# Patient Record
Sex: Female | Born: 1980 | Race: White | Hispanic: No | Marital: Married | Smoking: Never smoker
Health system: Southern US, Community
[De-identification: ages and names within clinical notes are randomized; demographics above are authoritative.]

---

## 2004-08-22 ENCOUNTER — Emergency Department (HOSPITAL_COMMUNITY): Admission: EM | Admit: 2004-08-22 | Discharge: 2004-08-22 | Payer: Self-pay | Admitting: Emergency Medicine

## 2005-01-02 ENCOUNTER — Inpatient Hospital Stay (HOSPITAL_COMMUNITY): Admission: AD | Admit: 2005-01-02 | Discharge: 2005-01-02 | Payer: Self-pay | Admitting: *Deleted

## 2005-01-15 ENCOUNTER — Ambulatory Visit: Payer: Self-pay | Admitting: *Deleted

## 2005-04-09 ENCOUNTER — Ambulatory Visit: Payer: Self-pay | Admitting: Unknown Physician Specialty

## 2005-04-30 ENCOUNTER — Ambulatory Visit: Payer: Self-pay | Admitting: Otolaryngology

## 2006-01-18 ENCOUNTER — Ambulatory Visit: Payer: Self-pay | Admitting: Otolaryngology

## 2006-04-06 IMAGING — US US PELVIS COMPLETE MODIFY
1 series · 14 of 25 positions shown · non-contrast
Comparison: None.

CLINICAL DATA: Irregular menstrual cycles, currently bleeding.  
 TRANSABDOMINAL AND TRANSVAGINAL PELVIC ULTRASOUND:
TECHNIQUE: Both transabdominal and transvaginal ultrasound examinations of the pelvis were performed including evaluation of the uterus, ovaries, adnexal regions, and pelvic cul-de-sac.

[Series 1: us pelvis complete modify · 0.39mm/px · 14 of 84 slices shown]
[im 1/84]
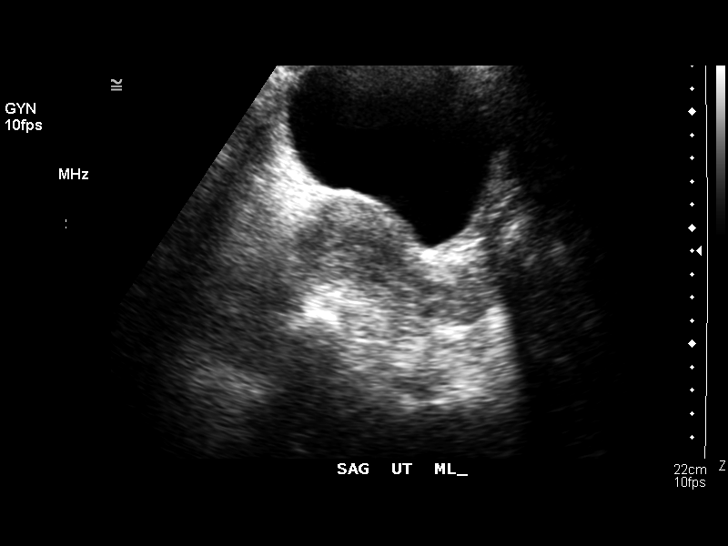
[im 7/84]
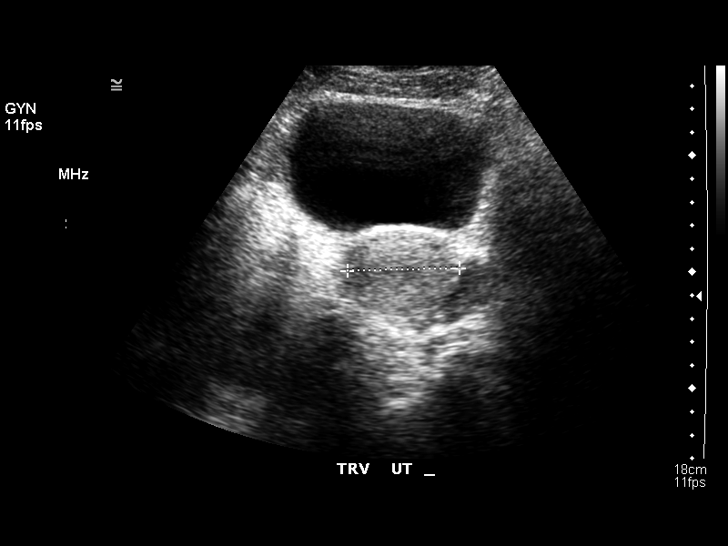
[im 14/84]
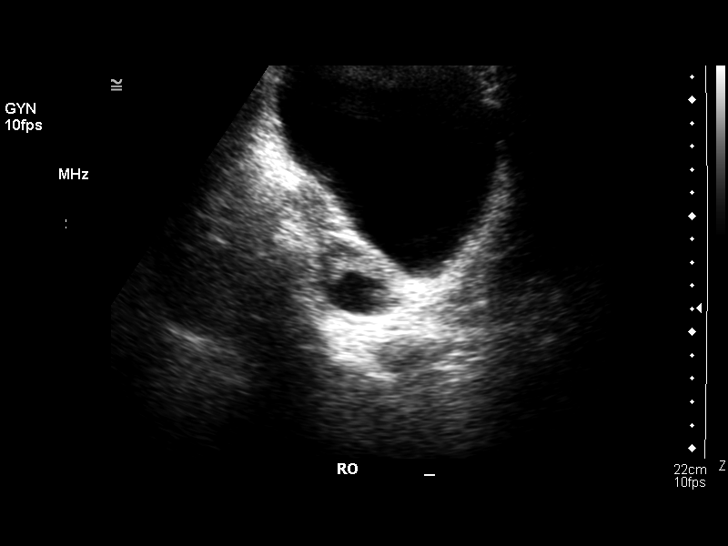
[im 21/84]
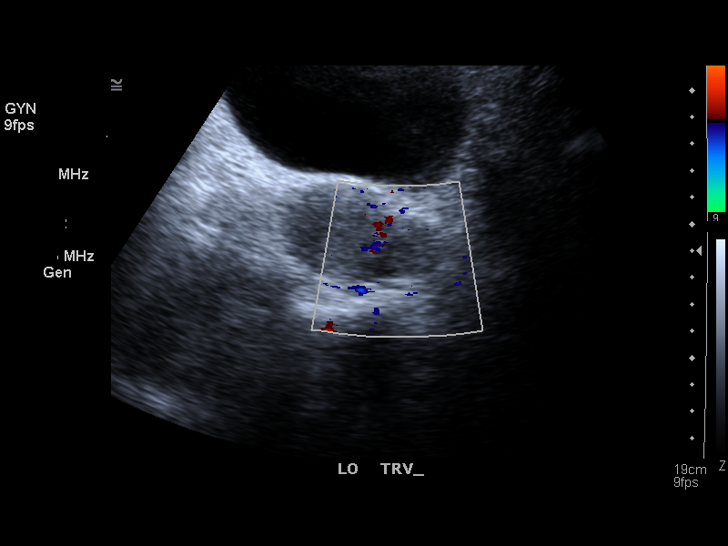
[im 28/84]
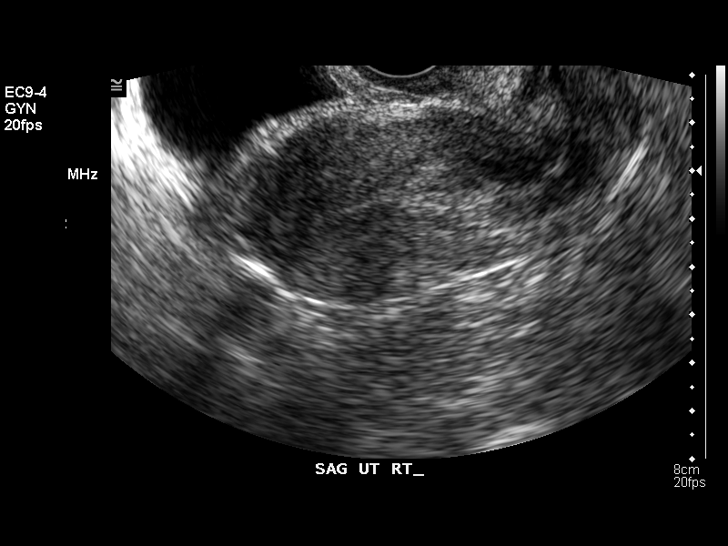
[im 32/84]
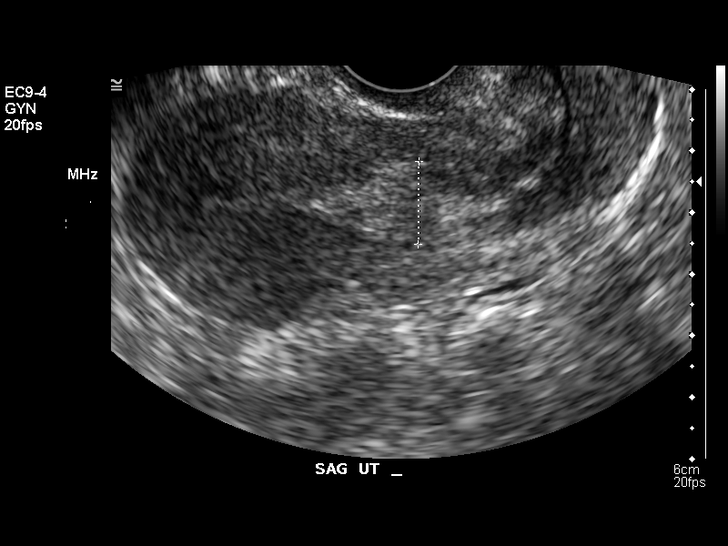
[im 39/84]
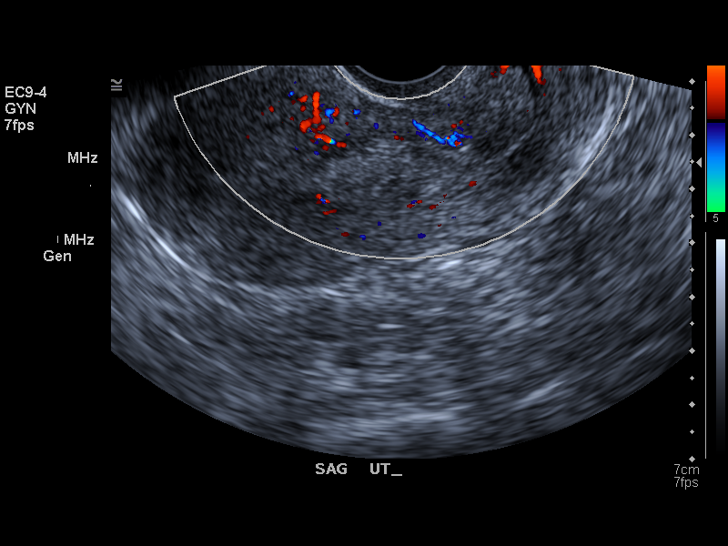
[im 45/84]
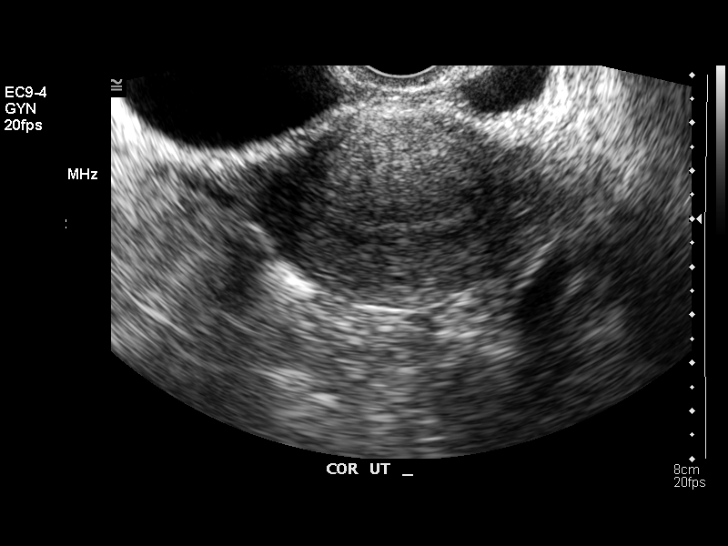
[im 52/84]
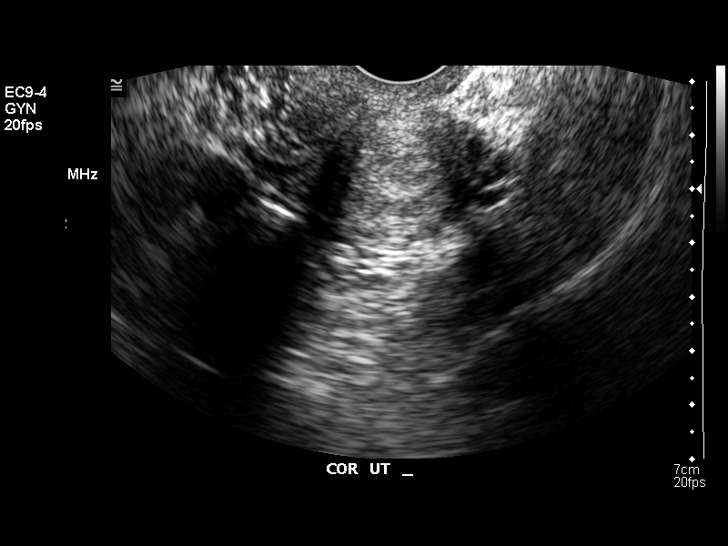
[im 56/84]
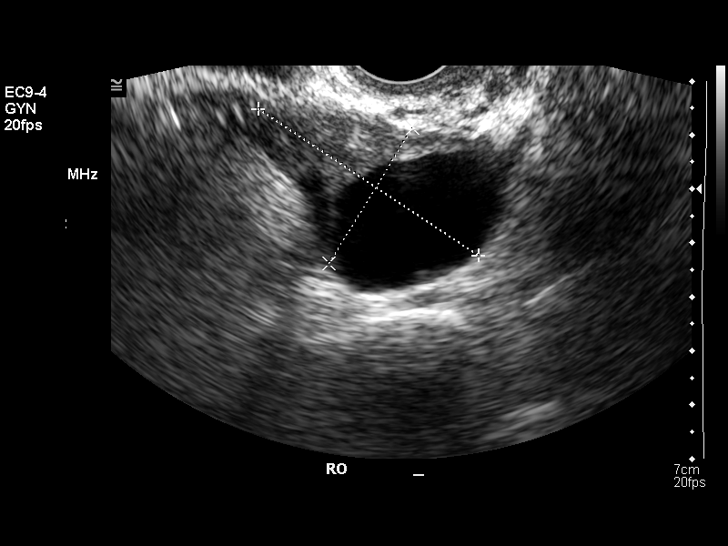
[im 63/84]
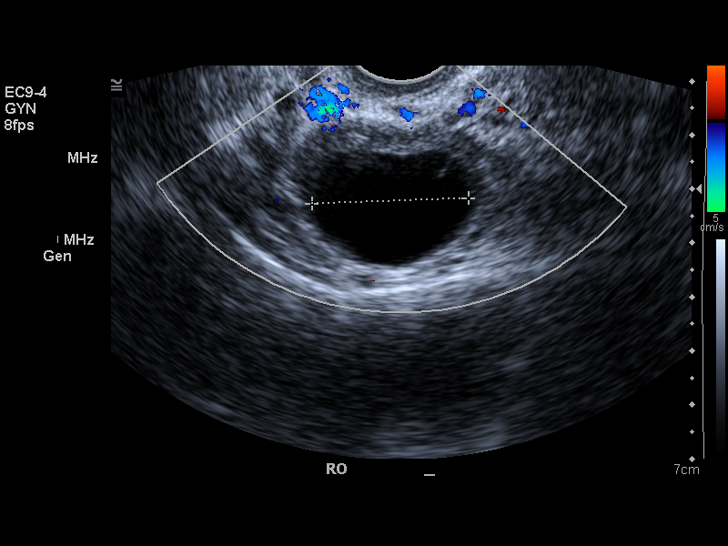
[im 70/84]
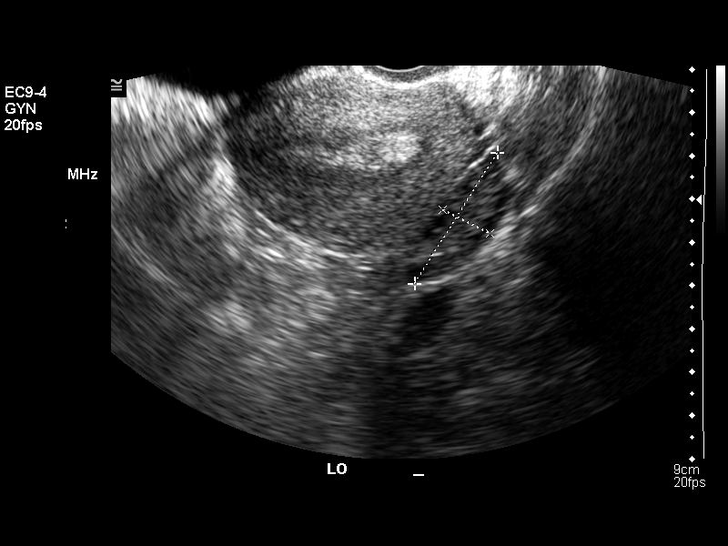
[im 77/84]
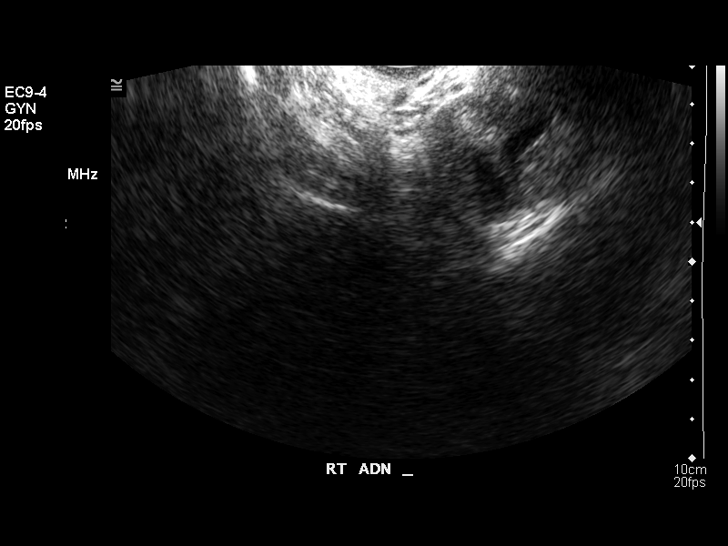
[im 84/84]
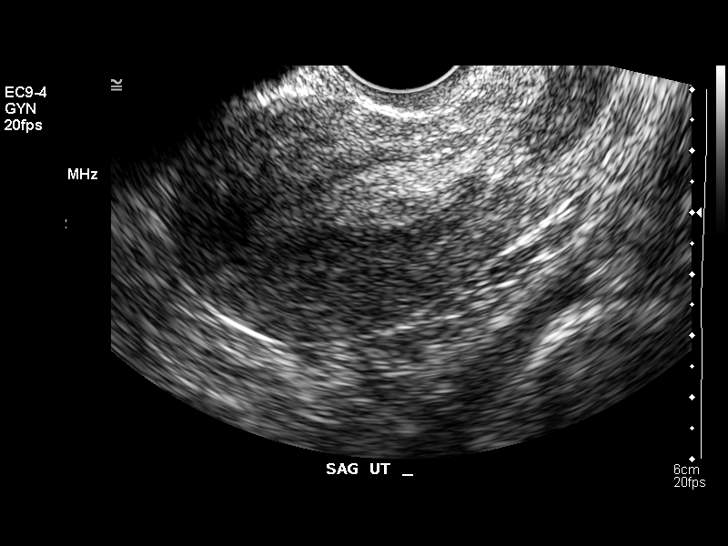

[14 of 25 positions shown; findings below may reference images not displayed]

FINDINGS: The uterus is unremarkable.  The endometrium is focally thickened at the mid-portion of the uterine body, with a maximal thickness measuring 1.9 cm.  There is a suggestion of an oval mass-like area of thickening, although no definitive color flow or stalk could be seen on color flow or Power Doppler imaging.  There is a 3.2 x 2.9 x 2.4 cm anechoic simple-appearing right ovarian cyst.  The left ovary is normal.  No free fluid is present.
IMPRESSION: 1.  Focal thickening of the mid-portion of the endometrium.  Appearance is most typical for a polyp, although endometrial hyperplasia and neoplasm could have a similar appearance.  This is a amenable to sonohysterogram performed after the patient is no longer actively bleeding.  
 2.  Simple appearing right ovarian cyst.

## 2008-02-01 ENCOUNTER — Ambulatory Visit: Payer: Self-pay | Admitting: Obstetrics & Gynecology

## 2009-08-22 ENCOUNTER — Inpatient Hospital Stay: Payer: Self-pay | Admitting: Obstetrics & Gynecology

## 2010-05-10 ENCOUNTER — Encounter: Payer: Self-pay | Admitting: *Deleted

## 2011-05-20 ENCOUNTER — Ambulatory Visit: Payer: Self-pay | Admitting: Unknown Physician Specialty

## 2015-12-12 ENCOUNTER — Other Ambulatory Visit: Payer: Self-pay | Admitting: Nurse Practitioner

## 2015-12-13 ENCOUNTER — Other Ambulatory Visit: Payer: Self-pay | Admitting: Nurse Practitioner

## 2015-12-13 DIAGNOSIS — N644 Mastodynia: Secondary | ICD-10-CM

## 2015-12-20 ENCOUNTER — Ambulatory Visit
Admission: RE | Admit: 2015-12-20 | Discharge: 2015-12-20 | Disposition: A | Discharge status: Home / Self Care | Payer: BC Managed Care – PPO | Source: Ambulatory Visit | Attending: Nurse Practitioner | Admitting: Nurse Practitioner

## 2015-12-20 ENCOUNTER — Other Ambulatory Visit: Payer: Self-pay | Admitting: Nurse Practitioner

## 2015-12-20 DIAGNOSIS — N644 Mastodynia: Secondary | ICD-10-CM

## 2015-12-20 DIAGNOSIS — R52 Pain, unspecified: Secondary | ICD-10-CM

## 2015-12-27 ENCOUNTER — Ambulatory Visit
Admission: RE | Admit: 2015-12-27 | Discharge: 2015-12-27 | Disposition: A | Discharge status: Home / Self Care | Payer: BC Managed Care – PPO | Source: Ambulatory Visit | Attending: Nurse Practitioner | Admitting: Nurse Practitioner

## 2015-12-27 DIAGNOSIS — R52 Pain, unspecified: Secondary | ICD-10-CM

## 2017-03-30 IMAGING — US US CHEST/MEDIASTINUM
1 series · 13 of 13 positions shown · non-contrast
Comparison: None.

CLINICAL DATA: Left anterior ventral chest wall pain just below the
left breast.

EXAM:
CHEST ULTRASOUND

[Series 1: us chest/mediastinum · 0.06mm/px · 13 acquisitions, 13 frames shown]
[im 1/13]
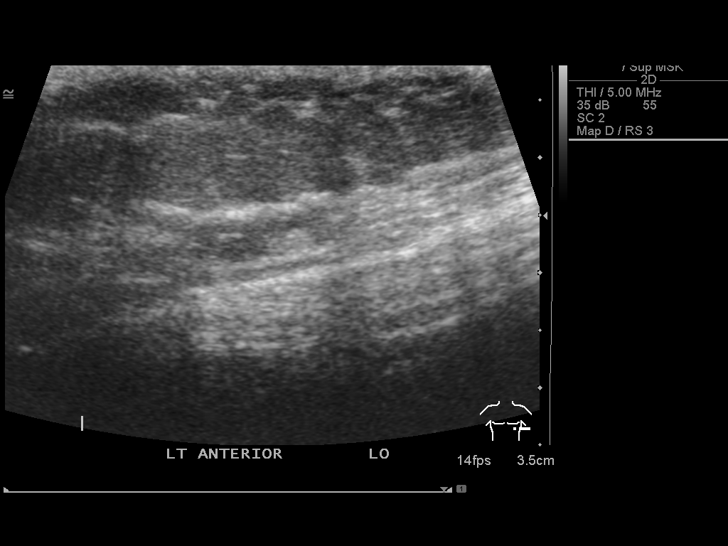
[im 2/13]
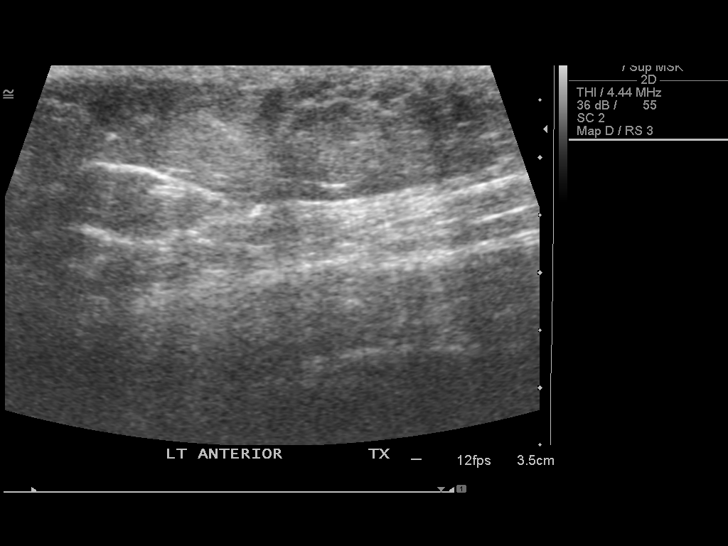
[im 3/13]
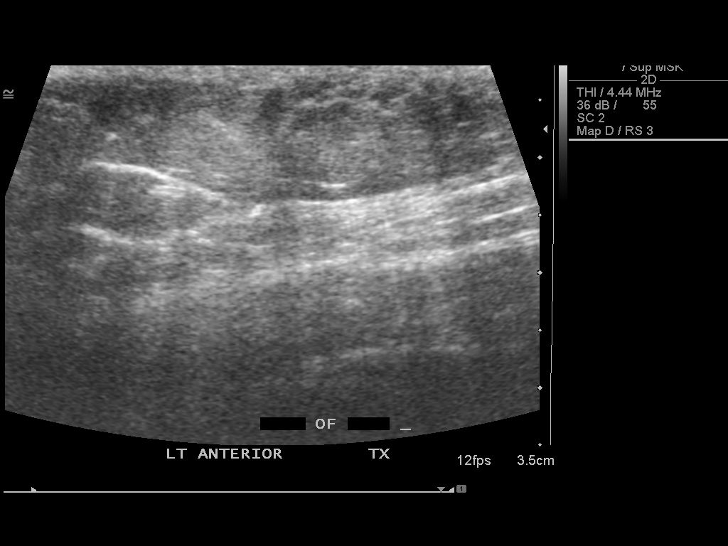
[im 4/13]
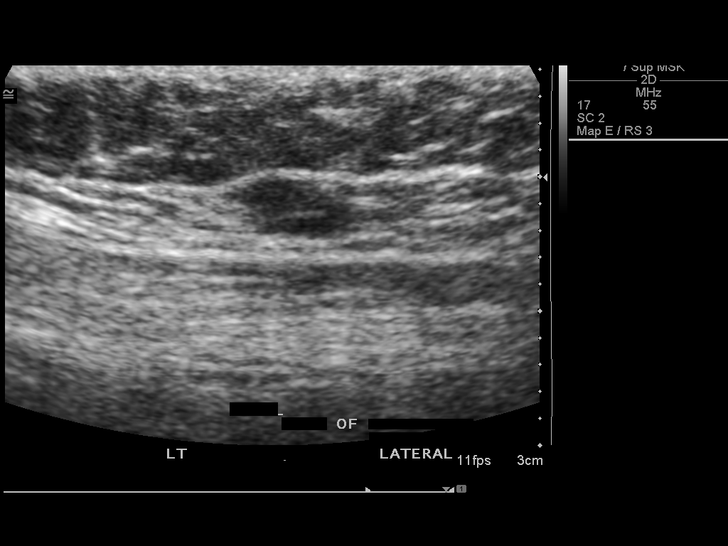
[im 5/13]
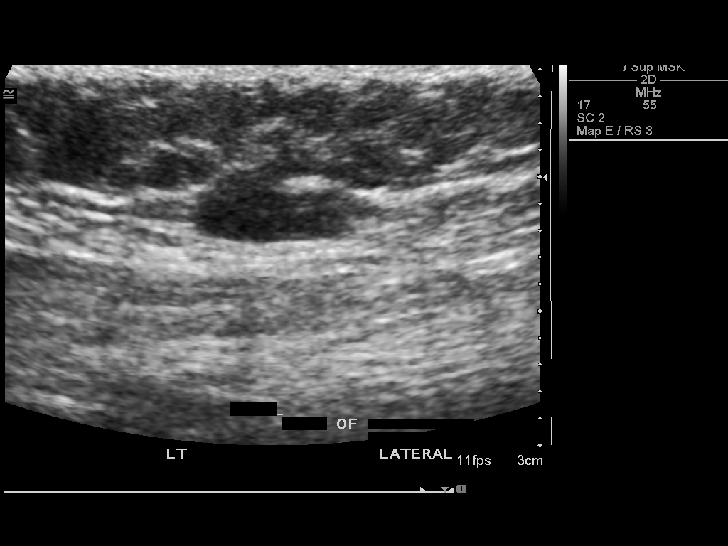
[im 6/13]
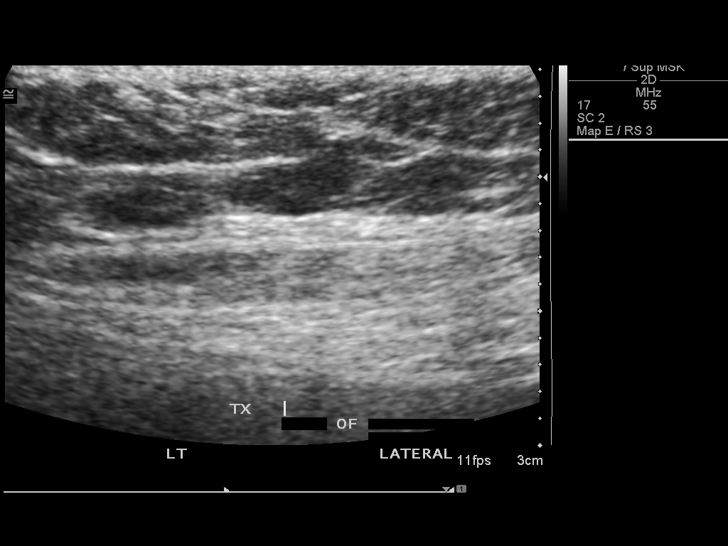
[im 7/13]
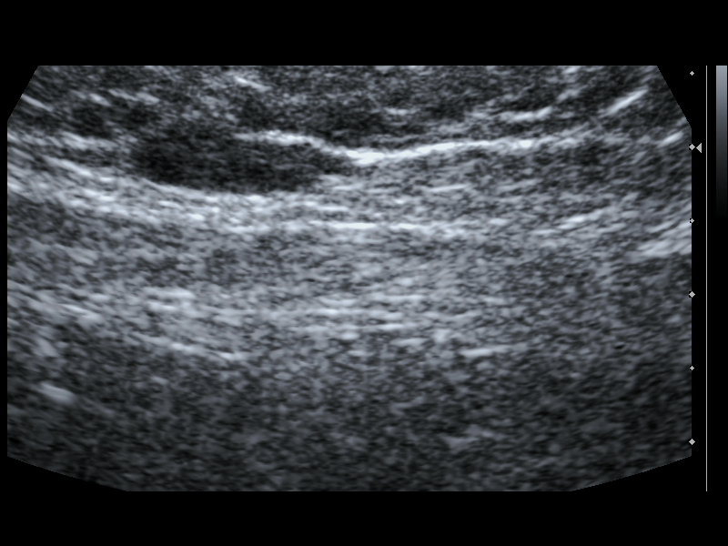
[im 8/13]
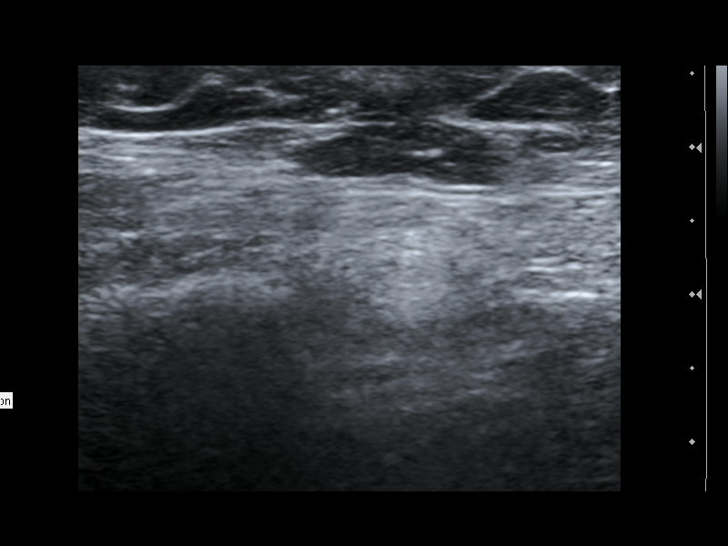
[im 9/13]
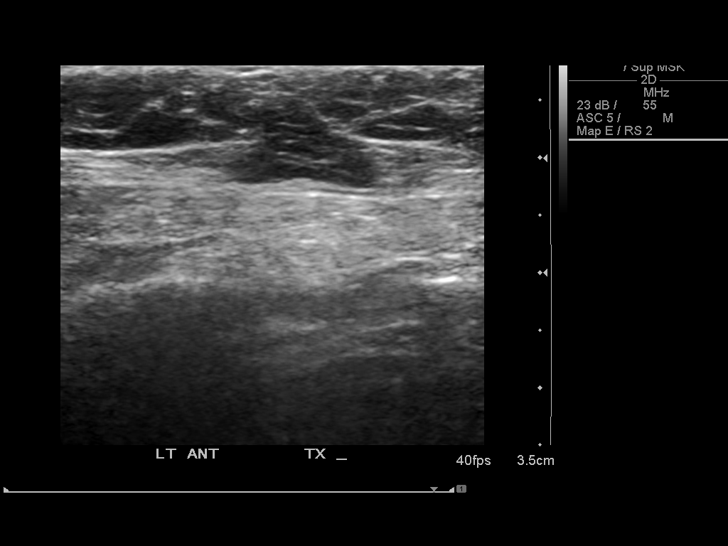
[im 10/13]
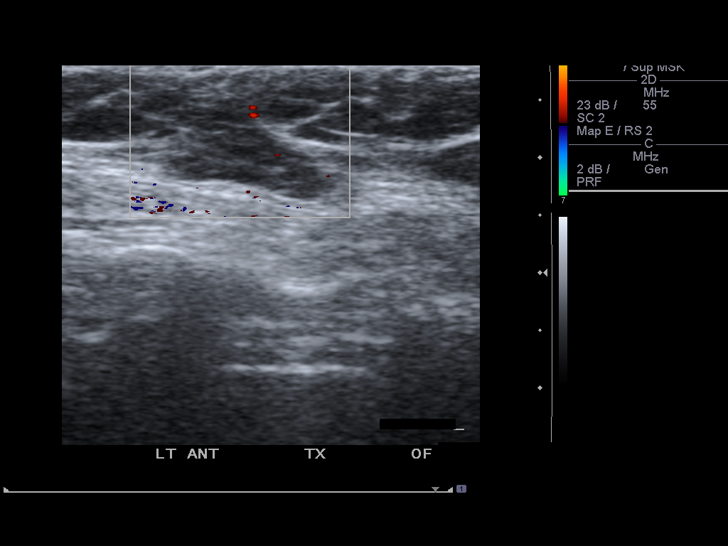
[im 11/13]
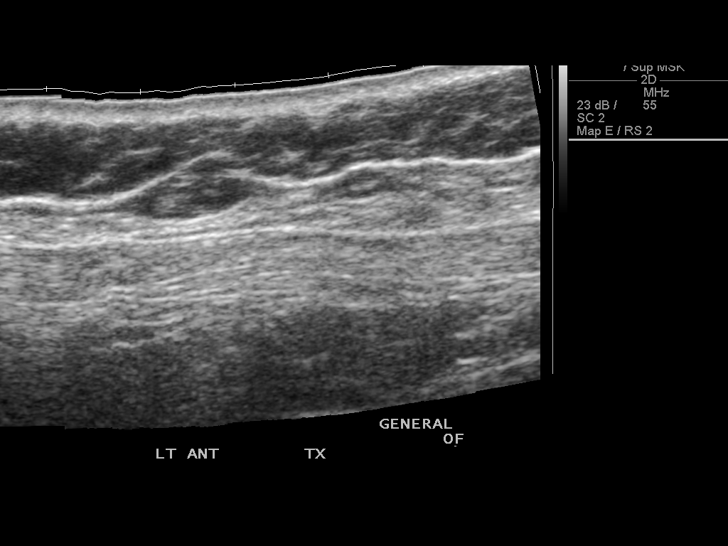
[im 12/13]
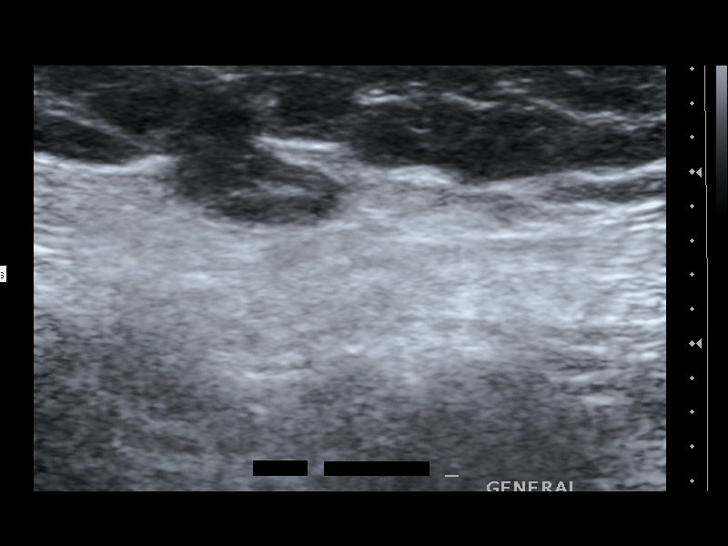
[im 13/13]
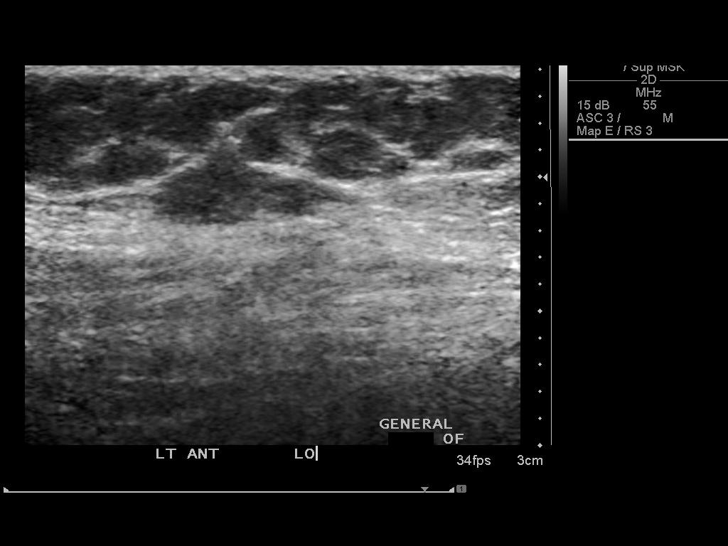

[13 of 13 positions shown; findings below may reference images not displayed]

FINDINGS: Focused ultrasound was performed over the area of concern. There is
a focal area within the left anterior chest wall just below the
inferior breasts line which has a similar echogenicity to the
adjacent fatty tissue. This measures approximately 5 mm. No cystic
mass identified.
IMPRESSION: 5 mm solid-appearing structure with an ultrasound appearance similar
to subcutaneous fat. This is indeterminate. More definitive
assessment may be obtained with MRI or CT with fiducial marker
placed over the area of concern.

## 2021-04-29 ENCOUNTER — Encounter: Payer: Self-pay | Admitting: Physician Assistant

## 2021-04-29 ENCOUNTER — Ambulatory Visit (INDEPENDENT_AMBULATORY_CARE_PROVIDER_SITE_OTHER): Payer: BC Managed Care – PPO | Admitting: Physician Assistant

## 2021-04-29 ENCOUNTER — Other Ambulatory Visit: Payer: Self-pay

## 2021-04-29 VITALS — BP 118/82 | HR 78 | Temp 97.9°F | Ht 63.0 in | Wt 191.0 lb

## 2021-04-29 DIAGNOSIS — Z83438 Family history of other disorder of lipoprotein metabolism and other lipidemia: Secondary | ICD-10-CM

## 2021-04-29 DIAGNOSIS — Z7689 Persons encountering health services in other specified circumstances: Secondary | ICD-10-CM

## 2021-04-29 DIAGNOSIS — Z1231 Encounter for screening mammogram for malignant neoplasm of breast: Secondary | ICD-10-CM | POA: Diagnosis not present

## 2021-04-29 DIAGNOSIS — R350 Frequency of micturition: Secondary | ICD-10-CM

## 2021-04-29 DIAGNOSIS — Z833 Family history of diabetes mellitus: Secondary | ICD-10-CM

## 2021-04-29 DIAGNOSIS — Z8249 Family history of ischemic heart disease and other diseases of the circulatory system: Secondary | ICD-10-CM

## 2021-04-29 NOTE — Progress Notes (Signed)
New Patient Office Visit  Subjective:  Patient ID: Emily Neal, female    DOB: 1980-10-10  Age: 41 y.o. MRN: [id number]  CC:  Chief Complaint  Patient presents with   New Patient (Initial Visit)    HPI Emily Neal presents to establish care. Patient reports has not been to a PCP for many years. Usually would go to urgent care for acute visits. Does not have an OB/GYN and last pap was several years ago (>5). Denies prior medical hx of hypertension, diabetes mellitus, thyroid problems or high cholesterol. Patient does have a family history of type 2 diabetes mellitus, hyperlipidemia, hypertension and heart attack. Patient has c/o urinary frequency. Reports gets at 3-4 times at night to urinate despite limiting her water intake few hours before bedtime. Also reports unable to hold urinary urgency for long after having a drink especially on road trips. Does not drink caffeine. No increased thirst or urination. Patient reports does not take medications or supplements. Has never been a smoker, does not drink alcohol or use illicit drugs. Patient is a Pharmacist, hospital at [employer].  History reviewed. No pertinent past medical history.  History reviewed. No pertinent surgical history.  History reviewed. No pertinent family history.  Social History   Socioeconomic History   Marital status: Married    Spouse name: Emily Neal   Number of children: 2   Years of education: 16   Highest education level: Bachelor's degree (e.g., BA, AB, BS)  Occupational History   Occupation: [employer]  Tobacco Use   Smoking status: Never   Smokeless tobacco: Never  Substance and Sexual Activity   Alcohol use: Not Currently   Drug use: Not Currently   Sexual activity: Yes    Partners: Male    Birth control/protection: Condom  Other Topics Concern   Not on file  Social History Narrative   Not on file   Social Determinants of Health   Financial Resource Strain: Not on file  Food Insecurity:  Not on file  Transportation Needs: Not on file  Physical Activity: Not on file  Stress: Not on file  Social Connections: Not on file  Intimate Partner Violence: Not on file    ROS Review of Systems Review of Systems:  A fourteen system review of systems was performed and found to be positive as per HPI.  Objective:   Today's Vitals: BP 118/82    Pulse 78    Temp 97.9 F (36.6 C)    Ht 5\' 3"  (1.6 m)    Wt 191 lb (86.6 kg)    LMP 04/29/2021    SpO2 99%    BMI 33.83 kg/m   Physical Exam General:  Well Developed, well nourished, appropriate for stated age.  Neuro:  Alert and oriented,  extra-ocular muscles intact  HEENT:  Normocephalic, atraumatic, neck supple, no carotid bruits appreciated  Skin:  no gross rash, warm, pink. Cardiac:  RRR, S1 S2 Respiratory: CTA B/L Vascular:  Ext warm, no cyanosis apprec.; cap RF less 2 sec. Psych:  No HI/SI, judgement and insight good, Euthymic mood. Full Affect.  Assessment & Plan:   Problem List Items Addressed This Visit   None Visit Diagnoses     Encounter to establish care    -  Primary   Encounter for screening mammogram for malignant neoplasm of breast       Relevant Orders   MM Digital Screening   Family history of type 2 diabetes mellitus  Family history of hypertension       Family history of hyperlipidemia       Urinary frequency          Encounter to establish care: -Discussed with patient recommend to r/o secondary causes of urinary frequency especially with family hx of T2DM. Pt verbalized understanding. Advised patient to schedule fasting lab visit. Recommend to continue with no caffeine and limiting liquids 2-3 hrs before bedtime.  -Will place order for screening mammogram. Patient is considering establishing care with OB/GYN for female exam. -Recommend to schedule CPE.   No outpatient encounter medications on file as of 04/29/2021.   No facility-administered encounter medications on file as of 04/29/2021.     Follow-up: Return for CPE and FBW a few days prior in 2-3 weeks.   Lorrene Reid, PA-C

## 2021-05-05 ENCOUNTER — Encounter: Payer: Self-pay | Admitting: Physician Assistant

## 2021-05-05 ENCOUNTER — Other Ambulatory Visit: Payer: Self-pay

## 2021-05-05 ENCOUNTER — Ambulatory Visit (INDEPENDENT_AMBULATORY_CARE_PROVIDER_SITE_OTHER): Payer: BC Managed Care – PPO | Admitting: Physician Assistant

## 2021-05-05 VITALS — BP 110/72 | HR 73 | Temp 98.2°F | Ht 63.0 in | Wt 191.0 lb

## 2021-05-05 DIAGNOSIS — Z1321 Encounter for screening for nutritional disorder: Secondary | ICD-10-CM

## 2021-05-05 DIAGNOSIS — Z0000 Encounter for general adult medical examination without abnormal findings: Secondary | ICD-10-CM

## 2021-05-05 DIAGNOSIS — Z01419 Encounter for gynecological examination (general) (routine) without abnormal findings: Secondary | ICD-10-CM | POA: Diagnosis not present

## 2021-05-05 DIAGNOSIS — Z13228 Encounter for screening for other metabolic disorders: Secondary | ICD-10-CM

## 2021-05-05 DIAGNOSIS — Z1329 Encounter for screening for other suspected endocrine disorder: Secondary | ICD-10-CM

## 2021-05-05 DIAGNOSIS — Z83438 Family history of other disorder of lipoprotein metabolism and other lipidemia: Secondary | ICD-10-CM | POA: Diagnosis not present

## 2021-05-05 DIAGNOSIS — Z833 Family history of diabetes mellitus: Secondary | ICD-10-CM

## 2021-05-05 DIAGNOSIS — Z130 Encounter for screening for diseases of the blood and blood-forming organs and certain disorders involving the immune mechanism: Secondary | ICD-10-CM

## 2021-05-05 NOTE — Patient Instructions (Signed)

## 2021-05-05 NOTE — Progress Notes (Signed)
Subjective:     Emily Neal is a 41 y.o. female and is here for a comprehensive physical exam. The patient reports no problems.  Social History   Socioeconomic History   Marital status: Married    Spouse name: Ryver Zadrozny   Number of children: 2   Years of education: 16   Highest education level: Bachelor's degree (e.g., BA, AB, BS)  Occupational History   Occupation: [employer]  Tobacco Use   Smoking status: Never   Smokeless tobacco: Never  Substance and Sexual Activity   Alcohol use: Not Currently   Drug use: Not Currently   Sexual activity: Yes    Partners: Male    Birth control/protection: Condom  Other Topics Concern   Not on file  Social History Narrative   Not on file   Social Determinants of Health   Financial Resource Strain: Not on file  Food Insecurity: Not on file  Transportation Needs: Not on file  Physical Activity: Not on file  Stress: Not on file  Social Connections: Not on file  Intimate Partner Violence: Not on file   Health Maintenance  Topic Date Due   COVID-19 Vaccine (1) Never done   HIV Screening  Never done   Hepatitis C Screening  Never done   TETANUS/TDAP  Never done   PAP SMEAR-Modifier  Never done   INFLUENZA VACCINE  Never done   Pneumococcal Vaccine 45-62 Years old  Aged Out   HPV VACCINES  Aged Out    The following portions of the patient's history were reviewed and updated as appropriate: allergies, current medications, past family history, past medical history, past social history, past surgical history, and problem list.  Review of Systems Pertinent items noted in HPI and remainder of comprehensive ROS otherwise negative.   Objective:    BP 110/72    Pulse 73    Temp 98.2 F (36.8 C)    Ht 5\' 3"  (1.6 m)    Wt 191 lb (86.6 kg)    LMP 04/29/2021    SpO2 99%    BMI 33.83 kg/m  General appearance: alert, cooperative, and no distress Head: Normocephalic, without obvious abnormality, atraumatic Eyes:  conjunctivae/corneas clear. PERRL, EOM's intact. Fundi benign. Ears: normal TM's and external ear canals both ears Nose: Nares normal. Septum midline. Mucosa normal. No drainage or sinus tenderness. Throat: lips, mucosa, and tongue normal; teeth and gums normal Neck: no adenopathy, no JVD, supple, symmetrical, trachea midline, and thyroid: normal to inspection and palpation Back: symmetric, no curvature. ROM normal. No CVA tenderness. Lungs: clear to auscultation bilaterally Heart: regular rate and rhythm, S1, S2 normal, no murmur, click, rub or gallop Abdomen: soft, non-tender; bowel sounds normal; no masses,  no organomegaly Extremities: extremities normal, atraumatic, no cyanosis or edema Pulses: 2+ and symmetric Skin: Skin color, texture, turgor normal. No rashes or lesions Lymph nodes: Cervical adenopathy: normal and Supraclavicular adenopathy: normal Neurologic: Grossly normal    Assessment:    Healthy female exam.     Plan:  -Will obtain routine fasting labs. -Will place referral to OB/Gyn for female exam including gyn exam. -Pt deferred Tdap, hep C and HIV screenings. -Recommend to follow a heart healthy diet low in fat and carbohydrates. -Encourage moderate aerobic exercise 150 minutes/week. -Discussed colonoscopy screening guidelines starting at the age of 41, no family history of first-degree relative with colon cancer. -Follow-up in 1 year for CPE and fasting blood work.  See After Visit Summary for Counseling Recommendations

## 2021-05-06 LAB — COMPREHENSIVE METABOLIC PANEL
ALT: 12 IU/L (ref 0–32)
AST: 17 IU/L (ref 0–40)
Albumin/Globulin Ratio: 1.8 (ref 1.2–2.2)
Albumin: 4.7 g/dL (ref 3.8–4.8)
Alkaline Phosphatase: 78 IU/L (ref 44–121)
BUN/Creatinine Ratio: 19 (ref 9–23)
BUN: 11 mg/dL (ref 6–24)
Bilirubin Total: <0.2 mg/dL (ref 0.0–1.2)
CO2: 23 mmol/L (ref 20–29)
Calcium: 9.8 mg/dL (ref 8.7–10.2)
Chloride: 101 mmol/L (ref 96–106)
Creatinine, Ser: 0.59 mg/dL (ref 0.57–1.00)
Globulin, Total: 2.6 g/dL (ref 1.5–4.5)
Glucose: 80 mg/dL (ref 70–99)
Potassium: 4.5 mmol/L (ref 3.5–5.2)
Sodium: 139 mmol/L (ref 134–144)
Total Protein: 7.3 g/dL (ref 6.0–8.5)
eGFR: 116 mL/min/{1.73_m2} (ref >59)

## 2021-05-06 LAB — CBC WITH DIFFERENTIAL/PLATELET
Basophils Absolute: 0.1 10*3/uL (ref 0.0–0.2)
Basos: 1 %
EOS (ABSOLUTE): 0.2 10*3/uL (ref 0.0–0.4)
Eos: 2 %
Hematocrit: 38.9 % (ref 34.0–46.6)
Hemoglobin: 12.6 g/dL (ref 11.1–15.9)
Immature Grans (Abs): 0 10*3/uL (ref 0.0–0.1)
Immature Granulocytes: 0 %
Lymphocytes Absolute: 2.8 10*3/uL (ref 0.7–3.1)
Lymphs: 29 %
MCH: 27.9 pg (ref 26.6–33.0)
MCHC: 32.4 g/dL (ref 31.5–35.7)
MCV: 86 fL (ref 79–97)
Monocytes Absolute: 0.6 10*3/uL (ref 0.1–0.9)
Monocytes: 6 %
Neutrophils Absolute: 6.2 10*3/uL (ref 1.4–7.0)
Neutrophils: 62 %
Platelets: 445 10*3/uL (ref 150–450)
RBC: 4.51 x10E6/uL (ref 3.77–5.28)
RDW: 13.1 % (ref 11.7–15.4)
WBC: 9.9 10*3/uL (ref 3.4–10.8)

## 2021-05-06 LAB — LIPID PANEL
Chol/HDL Ratio: 4.7 ratio — ABNORMAL HIGH (ref 0.0–4.4)
Cholesterol, Total: 249 mg/dL — ABNORMAL HIGH (ref 100–199)
HDL: 53 mg/dL (ref >39)
LDL Chol Calc (NIH): 122 mg/dL — ABNORMAL HIGH (ref 0–99)
Triglycerides: 423 mg/dL — ABNORMAL HIGH (ref 0–149)
VLDL Cholesterol Cal: 74 mg/dL — ABNORMAL HIGH (ref 5–40)

## 2021-05-06 LAB — HEMOGLOBIN A1C
Est. average glucose Bld gHb Est-mCnc: 117 mg/dL
Hgb A1c MFr Bld: 5.7 % — ABNORMAL HIGH (ref 4.8–5.6)

## 2021-05-06 LAB — TSH: TSH: 2.23 u[IU]/mL (ref 0.450–4.500)

## 2021-07-30 ENCOUNTER — Other Ambulatory Visit (HOSPITAL_COMMUNITY)
Admission: RE | Admit: 2021-07-30 | Discharge: 2021-07-30 | Disposition: A | Discharge status: Home / Self Care | Payer: BC Managed Care – PPO | Source: Ambulatory Visit | Attending: Certified Nurse Midwife | Admitting: Certified Nurse Midwife

## 2021-07-30 ENCOUNTER — Ambulatory Visit (INDEPENDENT_AMBULATORY_CARE_PROVIDER_SITE_OTHER): Payer: BC Managed Care – PPO | Admitting: Certified Nurse Midwife

## 2021-07-30 ENCOUNTER — Encounter: Payer: Self-pay | Admitting: Certified Nurse Midwife

## 2021-07-30 VITALS — BP 126/81 | HR 85 | Ht 63.0 in | Wt 186.6 lb

## 2021-07-30 DIAGNOSIS — Z01419 Encounter for gynecological examination (general) (routine) without abnormal findings: Secondary | ICD-10-CM

## 2021-07-30 NOTE — Progress Notes (Signed)
? ?ANNUAL EXAM ?Patient name: Emily Neal MRN [id number]  Date of birth: May 24, 1980 ?Chief Complaint:   ?Gynecologic Exam ? ?History of Present Illness:   ?Emily Neal is a 41 y.o. G66P2002 Caucasian female being seen today for a routine annual exam.  ?Current complaints: None ? ?Patient's last menstrual period was 07/17/2021 (approximate). ? ?The pregnancy intention screening data noted above was reviewed. Potential methods of contraception were discussed. The patient elected to proceed with Female Condom.  ? ?Last pap unsure. Results were: NILM w/ HRHPV negative. H/O abnormal pap: no ?Last mammogram: 2013. Results were: normal. Family h/o breast cancer: no ?Last colonoscopy: N/A. Results were: N/A. Family h/o colorectal cancer: no ? ? ?  04/29/2021  ?  4:19 PM  ?Depression screen PHQ 2/9  ?Decreased Interest 0  ?Down, Depressed, Hopeless 0  ?PHQ - 2 Score 0  ?Altered sleeping 0  ?Tired, decreased energy 0  ?Change in appetite 0  ?Feeling bad or failure about yourself  0  ?Trouble concentrating 0  ?Moving slowly or fidgety/restless 0  ?Suicidal thoughts 0  ?PHQ-9 Score 0  ?Difficult doing work/chores Not difficult at all  ? ?  ? ?  04/29/2021  ?  4:19 PM  ?GAD 7 : Generalized Anxiety Score  ?Nervous, Anxious, on Edge 0  ?Control/stop worrying 0  ?Worry too much - different things 0  ?Trouble relaxing 0  ?Restless 0  ?Easily annoyed or irritable 0  ?Afraid - awful might happen 0  ?Total GAD 7 Score 0  ?Anxiety Difficulty Not difficult at all  ? ? ? ?Review of Systems:   ?Pertinent items are noted in HPI ?Denies any headaches, blurred vision, fatigue, shortness of breath, chest pain, abdominal pain, abnormal vaginal discharge/itching/odor/irritation, problems with periods, bowel movements, urination, or intercourse unless otherwise stated above. ?Pertinent History Reviewed:  ?Reviewed past medical,surgical, social and family history.  ?Reviewed problem list, medications and allergies. ?Physical Assessment:   ? ?Vitals:  ? 07/30/21 1320  ?BP: 126/81  ?Pulse: 85  ?Weight: 186 lb 9.6 oz (84.6 kg)  ?Height: 5\' 3"  (1.6 m)  ? Body mass index is 33.05 kg/m?. ?  ?     Physical Examination:  ? General appearance - well appearing, and in no distress ? Mental status - alert, oriented to person, place, and time ? Psych:  She has a normal mood and affect ? Skin - warm and dry, normal color, no suspicious lesions noted ? Chest - effort normal ? Heart - normal rate and regular rhythm ? Neck:  midline trachea, no thyromegaly or nodules ?Breasts - breasts appear normal, no suspicious masses, no skin or nipple changes or axillary nodes ? Abdomen - soft, nontender, nondistended, no masses or organomegaly ? Pelvic - VULVA: normal appearing vulva with no masses, tenderness or lesions   VAGINA: normal appearing vagina with normal color and discharge, no lesions   CERVIX: normal appearing cervix without discharge or lesions, no CMT ? Thin prep pap is done with HR HPV cotesting ? Extremities:  No swelling or varicosities noted ? ?Chaperone present for exam ? ?No results found for this or any previous visit (from the past 24 hour(s)).  ?Assessment & Plan:  ?1. Women's annual routine gynecological examination ?- Will follow up results of pap smear and manage accordingly. ?- Mammogram scheduled ?- Routine preventative health maintenance measures emphasized. ? ?Mammogram: schedule screening mammo as soon as possible, or sooner if problems ?Colonoscopy: @ 41yo, or sooner if problems ? ?No orders  of the defined types were placed in this encounter. ? ? ?Meds: No orders of the defined types were placed in this encounter. ? ? ?Follow-up: Return in about 1 year (around 07/31/2022), or as needed, for ANN. ? ?Bernerd Limbo, CNM ?07/30/2021 ?3:32 PM ?

## 2021-08-06 LAB — CYTOLOGY - PAP
Adequacy: ABSENT
Comment: NEGATIVE
Diagnosis: NEGATIVE
High risk HPV: NEGATIVE

## 2021-09-04 ENCOUNTER — Ambulatory Visit: Payer: BC Managed Care – PPO

## 2021-10-31 ENCOUNTER — Other Ambulatory Visit: Payer: Self-pay | Admitting: Physician Assistant

## 2021-10-31 DIAGNOSIS — Z0000 Encounter for general adult medical examination without abnormal findings: Secondary | ICD-10-CM

## 2021-10-31 DIAGNOSIS — Z833 Family history of diabetes mellitus: Secondary | ICD-10-CM

## 2021-10-31 DIAGNOSIS — Z83438 Family history of other disorder of lipoprotein metabolism and other lipidemia: Secondary | ICD-10-CM

## 2021-11-03 ENCOUNTER — Other Ambulatory Visit: Payer: BC Managed Care – PPO

## 2021-11-03 DIAGNOSIS — Z833 Family history of diabetes mellitus: Secondary | ICD-10-CM

## 2021-11-03 DIAGNOSIS — Z0000 Encounter for general adult medical examination without abnormal findings: Secondary | ICD-10-CM

## 2021-11-03 DIAGNOSIS — Z83438 Family history of other disorder of lipoprotein metabolism and other lipidemia: Secondary | ICD-10-CM

## 2021-11-04 LAB — HEMOGLOBIN A1C
Est. average glucose Bld gHb Est-mCnc: 123 mg/dL
Hgb A1c MFr Bld: 5.9 % — ABNORMAL HIGH (ref 4.8–5.6)

## 2021-11-04 LAB — LIPID PANEL
Chol/HDL Ratio: 4.1 ratio (ref 0.0–4.4)
Cholesterol, Total: 215 mg/dL — ABNORMAL HIGH (ref 100–199)
HDL: 52 mg/dL (ref >39)
LDL Chol Calc (NIH): 113 mg/dL — ABNORMAL HIGH (ref 0–99)
Triglycerides: 291 mg/dL — ABNORMAL HIGH (ref 0–149)
VLDL Cholesterol Cal: 50 mg/dL — ABNORMAL HIGH (ref 5–40)

## 2022-08-10 ENCOUNTER — Encounter: Payer: Self-pay | Admitting: Family Medicine

## 2022-08-10 ENCOUNTER — Ambulatory Visit: Payer: BC Managed Care – PPO | Admitting: Family Medicine

## 2022-08-10 VITALS — BP 100/68 | HR 69 | Temp 98.4°F | Ht 63.0 in | Wt 193.0 lb

## 2022-08-10 DIAGNOSIS — H9202 Otalgia, left ear: Secondary | ICD-10-CM | POA: Diagnosis not present

## 2022-08-10 MED ORDER — FLUTICASONE PROPIONATE 50 MCG/ACT NA SUSP: 2.0000 | Freq: Every day | NASAL | 6 refills | Status: AC

## 2022-08-10 NOTE — Assessment & Plan Note (Signed)
Eustachian tube dysfunction most likely causes.  2/2 mild allergies.  No hx of allergies.  This season is particularly bad though. No tmj pain.  No vertigo.  Exam showed possible tm bulging.  Otherwise normal.   - flonase  - ent referral if not improving.  - counseled on reasons to seek medical attention including vertigo

## 2022-08-10 NOTE — Patient Instructions (Signed)
You should take flonase or fluticasone spray once a day (in both nostrils) for several weeks.  You may have allergies that could be causing inflammaiton of your eustachian tube.    If you get any vertigo (room spinning sensation) please schedule with [place] or an urgent care.  If the symptoms are not going away and you want an ear specialist to look at it, we can refer you to one.    Have a great day,   Dr. Constance Goltz

## 2022-08-10 NOTE — Progress Notes (Signed)
   Established Patient Office Visit  Subjective   Patient ID: Emily Neal, female    DOB: 15-Aug-1980  Age: 42 y.o. MRN: [id number]  Chief Complaint  Patient presents with   Ear Pain    Ear pain starting Wednesday and worst on Friday.  Has gotten better through the weekend.  Has been taking ibuprofen all weekend.  It feels like a burning pain.  Pain feels like it is 'inside' the ear drum.  No hx of ear infection.  Hearing feels somewhat muffled.  No tinnitus.  No nausea or vision changes.  This weekend he felt a little bit dizzy/lightheaded.  No vertigo.  No feeling like she was leaning 1 way or the other.  No cough, runny nose.  No hx of allergies.         ROS    Objective:     BP 100/68   Pulse 69   Temp 98.4 F (36.9 C) (Oral)   Ht  (1.6 m)   Wt 193 lb 0.1 oz (87.5 kg)   LMP 08/02/2022 (Exact Date)   SpO2 99%   BMI 34.19 kg/m    Physical Exam Gen: alert, oriented Heent: normal right TM.  Left TM appears slightly bulging.  No erythema or effusion.   Cv: rrr Pulm: lctab   No results found for any visits on 08/10/22.    The 10-year ASCVD risk score (Arnett DK, et al., 2019) is: 0.5%    Assessment & Plan:   Problem List Items Addressed This Visit       Other   Ear pain, left - Primary    Eustachian tube dysfunction most likely causes.  2/2 mild allergies.  No hx of allergies.  This season is particularly bad though. No tmj pain.  No vertigo.  Exam showed possible tm bulging.  Otherwise normal.   - flonase  - ent referral if not improving.  - counseled on reasons to seek medical attention including vertigo       Return if symptoms worsen or fail to improve.    Sandre Kitty, MD

## 2022-12-11 ENCOUNTER — Other Ambulatory Visit: Payer: BC Managed Care – PPO

## 2022-12-11 DIAGNOSIS — Z833 Family history of diabetes mellitus: Secondary | ICD-10-CM

## 2022-12-11 DIAGNOSIS — Z0000 Encounter for general adult medical examination without abnormal findings: Secondary | ICD-10-CM

## 2022-12-12 LAB — COMPREHENSIVE METABOLIC PANEL
ALT: 14 IU/L (ref 0–32)
AST: 19 IU/L (ref 0–40)
Albumin: 4.5 g/dL (ref 3.9–4.9)
Alkaline Phosphatase: 77 IU/L (ref 44–121)
BUN/Creatinine Ratio: 20 (ref 9–23)
BUN: 13 mg/dL (ref 6–24)
Bilirubin Total: <0.2 mg/dL (ref 0.0–1.2)
CO2: 23 mmol/L (ref 20–29)
Calcium: 9.6 mg/dL (ref 8.7–10.2)
Chloride: 101 mmol/L (ref 96–106)
Creatinine, Ser: 0.66 mg/dL (ref 0.57–1.00)
Globulin, Total: 3 g/dL (ref 1.5–4.5)
Glucose: 88 mg/dL (ref 70–99)
Potassium: 4.7 mmol/L (ref 3.5–5.2)
Sodium: 140 mmol/L (ref 134–144)
Total Protein: 7.5 g/dL (ref 6.0–8.5)
eGFR: 112 mL/min/{1.73_m2} (ref >59)

## 2022-12-12 LAB — CBC
Hematocrit: 40.2 % (ref 34.0–46.6)
Hemoglobin: 13 g/dL (ref 11.1–15.9)
MCH: 28.2 pg (ref 26.6–33.0)
MCHC: 32.3 g/dL (ref 31.5–35.7)
MCV: 87 fL (ref 79–97)
Platelets: 414 10*3/uL (ref 150–450)
RBC: 4.61 x10E6/uL (ref 3.77–5.28)
RDW: 14.1 % (ref 11.7–15.4)
WBC: 10.5 10*3/uL (ref 3.4–10.8)

## 2022-12-12 LAB — LIPID PANEL
Chol/HDL Ratio: 3.8 ratio (ref 0.0–4.4)
Cholesterol, Total: 229 mg/dL — ABNORMAL HIGH (ref 100–199)
HDL: 60 mg/dL (ref >39)
LDL Chol Calc (NIH): 129 mg/dL — ABNORMAL HIGH (ref 0–99)
Triglycerides: 227 mg/dL — ABNORMAL HIGH (ref 0–149)
VLDL Cholesterol Cal: 40 mg/dL (ref 5–40)

## 2022-12-12 LAB — HEMOGLOBIN A1C
Est. average glucose Bld gHb Est-mCnc: 120 mg/dL
Hgb A1c MFr Bld: 5.8 % — ABNORMAL HIGH (ref 4.8–5.6)

## 2022-12-16 ENCOUNTER — Encounter: Payer: Self-pay | Admitting: Family Medicine

## 2022-12-16 ENCOUNTER — Ambulatory Visit (INDEPENDENT_AMBULATORY_CARE_PROVIDER_SITE_OTHER): Payer: BC Managed Care – PPO | Admitting: Family Medicine

## 2022-12-16 VITALS — BP 138/68 | HR 77 | Ht 63.0 in | Wt 191.4 lb

## 2022-12-16 DIAGNOSIS — K529 Noninfective gastroenteritis and colitis, unspecified: Secondary | ICD-10-CM | POA: Diagnosis not present

## 2022-12-16 DIAGNOSIS — E782 Mixed hyperlipidemia: Secondary | ICD-10-CM

## 2022-12-16 DIAGNOSIS — R7303 Prediabetes: Secondary | ICD-10-CM

## 2022-12-16 DIAGNOSIS — E785 Hyperlipidemia, unspecified: Secondary | ICD-10-CM | POA: Insufficient documentation

## 2022-12-16 NOTE — Patient Instructions (Signed)
It was nice to see you today,  We addressed the following topics today: -Your A1c was in the prediabetic range again.  Your cholesterol was again slightly elevated.  Things you can eliminate to help improve your cholesterol include dairy products and fatty foods such as hamburger, bacon, other fatty meats. - For your diarrhea, try elevating milk and dairy for at least 2 weeks.  This includes water, milk, cheese, yogurt, ice cream.  If this does not improve your symptoms you can try eliminating gluten after that. - You can also take once a day Metamucil, up to 3 times a day eventually, to help add bulk to the stool.  Have a great day,  Frederic Jericho, MD

## 2022-12-16 NOTE — Assessment & Plan Note (Signed)
Uncertain cause at the moment.  Advised patient to try elimination diet starting with lactose first, then gluten if this is not effective.  Follow-up in 1 month.  If still bothering her at 1 month can get additional testing including stool studies for infectious causes, fecal calprotectin, TTG.  If still persistent in 1 month consider referral to gastroenterology.

## 2022-12-16 NOTE — Assessment & Plan Note (Signed)
Counseled on weight loss, dietary modifications, low-carb diet.  Recheck in 1 year at next physical.

## 2022-12-16 NOTE — Assessment & Plan Note (Signed)
Counseled on weight loss, dietary lifestyle modifications.  Recheck in 1 year.

## 2022-12-16 NOTE — Progress Notes (Signed)
   Established Patient Office Visit  Subjective   Patient ID: Emily Neal, female    DOB: 05-29-1980  Age: 42 y.o. MRN: [id number]  Chief Complaint  Patient presents with   Annual Exam    HPI Prediabetes-patient is here for her physical.  Prediabetes-we discussed the patient's A1c and ways to improve this including low carbohydrate diet, exercise and weight loss.  HLD-we discussed the patient's cholesterol levels, ways to improve this with diet and weight loss.  Diarrhea-patient states that for the past several months she has had issues with diarrhea on a daily basis.  Has 1 to 3-4 bowel movements per day.  When she needs to go to the bathroom she has to go urgently and sometimes does not make it in time.  Describes the diarrhea as "explosive".  No blood in the diarrhea.  No abdominal pain.  Still has her gallbladder.  Has tried eliminating milk from her diet but not other dairy products.  Has multiple outside pets and farm animals including cats, dogs, she, chickens.  No family members with a history of colorectal cancer.  Patient has 2 children, is married, works as a Runner, broadcasting/film/video at [employer].  Children go to [employer] [employer].  She does not use alcohol or tobacco.   The 10-year ASCVD risk score (Arnett DK, et al., 2019) is: 0.8%  Health Maintenance Due  Topic Date Due   HIV Screening  Never done   Hepatitis C Screening  Never done   DTaP/Tdap/Td (1 - Tdap) Never done   COVID-19 Vaccine (1 - 2023-24 season) Never done   INFLUENZA VACCINE  11/19/2022      Objective:     BP 138/68   Pulse 77   Ht 5\' 3"  (1.6 m)   Wt 191 lb 6.4 oz (86.8 kg)   LMP 12/16/2022   SpO2 99%   BMI 33.90 kg/m    Physical Exam General: Alert, oriented HEENT: PERRLA, EOMI, moist mucous CV: Regular rate rhythm Pulmonary: Lungs clear bilaterally no wheeze or crackles GI: Soft, nontender.  Normal bowel sounds MSK: Strength equal bilaterally Psych: Pleasant affect Skin:  Warm and dry   No results found for any visits on 12/16/22.      Assessment & Plan:   Moderate mixed hyperlipidemia not requiring statin therapy Assessment & Plan: Counseled on weight loss, dietary modifications, low-carb diet.  Recheck in 1 year at next physical.   Prediabetes Assessment & Plan: Counseled on weight loss, dietary lifestyle modifications.  Recheck in 1 year.   Chronic diarrhea Assessment & Plan: Uncertain cause at the moment.  Advised patient to try elimination diet starting with lactose first, then gluten if this is not effective.  Follow-up in 1 month.  If still bothering her at 1 month can get additional testing including stool studies for infectious causes, fecal calprotectin, TTG.  If still persistent in 1 month consider referral to gastroenterology.      Return in about 4 weeks (around 01/13/2023) for GI.    Sandre Kitty, MD

## 2022-12-17 ENCOUNTER — Other Ambulatory Visit: Payer: Self-pay | Admitting: Family Medicine

## 2022-12-17 DIAGNOSIS — Z1231 Encounter for screening mammogram for malignant neoplasm of breast: Secondary | ICD-10-CM

## 2022-12-23 DIAGNOSIS — Z1231 Encounter for screening mammogram for malignant neoplasm of breast: Secondary | ICD-10-CM

## 2023-01-19 ENCOUNTER — Ambulatory Visit: Payer: BC Managed Care – PPO | Admitting: Family Medicine

## 2023-01-19 NOTE — Progress Notes (Deleted)
   Established Patient Office Visit  Subjective   Patient ID: Emily Neal, female    DOB: [birth year]  Age: 42 y.o. MRN: [id number]  No chief complaint on file.   HPI  Elimination diet.  Stool studies? Ttg, fecal calprotectin?    The 10-year ASCVD risk score (Arnett DK, et al., 2019) is: 0.8%  Health Maintenance Due  Topic Date Due   HIV Screening  Never done   Hepatitis C Screening  Never done   DTaP/Tdap/Td (1 - Tdap) Never done   INFLUENZA VACCINE  Never done   COVID-19 Vaccine (1 - 2023-24 season) Never done      Objective:     There were no vitals taken for this visit. {Vitals History (Optional):23777}  Physical Exam   No results found for any visits on 01/19/23.      Assessment & Plan:   There are no diagnoses linked to this encounter.   No follow-ups on file.    Sandre Kitty, MD
